# Patient Record
Sex: Female | Born: 1975 | Race: Black or African American | Hispanic: No | Marital: Single | Smoking: Never smoker
Health system: Southern US, Community
[De-identification: ages and names within clinical notes are randomized; demographics above are authoritative.]

## PROBLEM LIST (undated history)

## (undated) HISTORY — PX: ECTOPIC PREGNANCY SURGERY: SHX613

---

## 2008-04-16 ENCOUNTER — Emergency Department (HOSPITAL_COMMUNITY): Admission: EM | Admit: 2008-04-16 | Discharge: 2008-04-16 | Payer: Self-pay | Admitting: Emergency Medicine

## 2009-07-04 ENCOUNTER — Emergency Department (HOSPITAL_BASED_OUTPATIENT_CLINIC_OR_DEPARTMENT_OTHER): Admission: EM | Admit: 2009-07-04 | Discharge: 2009-07-04 | Payer: Self-pay | Admitting: Emergency Medicine

## 2009-07-04 ENCOUNTER — Ambulatory Visit: Payer: Self-pay | Admitting: Diagnostic Radiology

## 2010-07-09 LAB — URINE MICROSCOPIC-ADD ON

## 2010-07-09 LAB — URINALYSIS, ROUTINE W REFLEX MICROSCOPIC
Bilirubin Urine: NEGATIVE
Glucose, UA: NEGATIVE mg/dL
Hgb urine dipstick: NEGATIVE

## 2010-07-09 LAB — PREGNANCY, URINE: Preg Test, Ur: NEGATIVE

## 2010-07-30 LAB — WET PREP, GENITAL: WBC, Wet Prep HPF POC: NONE SEEN

## 2010-07-30 LAB — URINALYSIS, ROUTINE W REFLEX MICROSCOPIC
Glucose, UA: NEGATIVE mg/dL
Ketones, ur: NEGATIVE mg/dL
Nitrite: NEGATIVE
Protein, ur: NEGATIVE mg/dL
Specific Gravity, Urine: 1.01 (ref 1.005–1.030)
Urobilinogen, UA: 0.2 mg/dL (ref 0.0–1.0)
pH: 6 (ref 5.0–8.0)

## 2018-03-11 ENCOUNTER — Emergency Department (HOSPITAL_COMMUNITY): Payer: BLUE CROSS/BLUE SHIELD

## 2018-03-11 ENCOUNTER — Encounter (HOSPITAL_COMMUNITY): Payer: Self-pay

## 2018-03-11 ENCOUNTER — Other Ambulatory Visit: Payer: Self-pay

## 2018-03-11 ENCOUNTER — Emergency Department (HOSPITAL_COMMUNITY)
Admission: EM | Admit: 2018-03-11 | Discharge: 2018-03-11 | Disposition: A | Discharge status: Home / Self Care | Payer: BLUE CROSS/BLUE SHIELD | Attending: Emergency Medicine | Admitting: Emergency Medicine

## 2018-03-11 DIAGNOSIS — R102 Pelvic and perineal pain: Secondary | ICD-10-CM | POA: Diagnosis not present

## 2018-03-11 DIAGNOSIS — R1032 Left lower quadrant pain: Secondary | ICD-10-CM | POA: Diagnosis present

## 2018-03-11 LAB — COMPREHENSIVE METABOLIC PANEL
ALBUMIN: 4 g/dL (ref 3.5–5.0)
ALT: 15 U/L (ref 0–44)
AST: 14 U/L — AB (ref 15–41)
Alkaline Phosphatase: 43 U/L (ref 38–126)
Anion gap: 6 (ref 5–15)
BUN: 10 mg/dL (ref 6–20)
CHLORIDE: 108 mmol/L (ref 98–111)
CO2: 25 mmol/L (ref 22–32)
CREATININE: 0.8 mg/dL (ref 0.44–1.00)
Calcium: 8.8 mg/dL — ABNORMAL LOW (ref 8.9–10.3)
GFR calc Af Amer: >60 mL/min (ref >60)
GFR calc non Af Amer: >60 mL/min (ref >60)
GLUCOSE: 83 mg/dL (ref 70–99)
POTASSIUM: 3.6 mmol/L (ref 3.5–5.1)
SODIUM: 139 mmol/L (ref 135–145)
Total Bilirubin: 0.7 mg/dL (ref 0.3–1.2)
Total Protein: 6.9 g/dL (ref 6.5–8.1)

## 2018-03-11 LAB — URINALYSIS, ROUTINE W REFLEX MICROSCOPIC
Bilirubin Urine: NEGATIVE
Glucose, UA: NEGATIVE mg/dL
Ketones, ur: NEGATIVE mg/dL
Leukocytes, UA: NEGATIVE
NITRITE: NEGATIVE
PROTEIN: NEGATIVE mg/dL
Specific Gravity, Urine: 1.023 (ref 1.005–1.030)
pH: 5 (ref 5.0–8.0)

## 2018-03-11 LAB — WET PREP, GENITAL
Clue Cells Wet Prep HPF POC: NONE SEEN
Sperm: NONE SEEN
TRICH WET PREP: NONE SEEN
Yeast Wet Prep HPF POC: NONE SEEN

## 2018-03-11 LAB — CBC
HEMATOCRIT: 38.4 % (ref 36.0–46.0)
HEMOGLOBIN: 12.4 g/dL (ref 12.0–15.0)
MCH: 30.5 pg (ref 26.0–34.0)
MCHC: 32.3 g/dL (ref 30.0–36.0)
MCV: 94.6 fL (ref 80.0–100.0)
Platelets: 343 10*3/uL (ref 150–400)
RBC: 4.06 MIL/uL (ref 3.87–5.11)
RDW: 12.4 % (ref 11.5–15.5)
WBC: 8.1 10*3/uL (ref 4.0–10.5)
nRBC: 0 % (ref 0.0–0.2)

## 2018-03-11 LAB — LIPASE, BLOOD: LIPASE: 30 U/L (ref 11–51)

## 2018-03-11 LAB — I-STAT BETA HCG BLOOD, ED (MC, WL, AP ONLY)

## 2018-03-11 MED ORDER — AZITHROMYCIN 250 MG PO TABS
1000.0000 mg | ORAL_TABLET | Freq: Once | ORAL | Status: AC
  Administered 2018-03-11: 1000 mg via ORAL
  Filled 2018-03-11: qty 4

## 2018-03-11 MED ORDER — LIDOCAINE HCL (PF) 1 % IJ SOLN
INTRAMUSCULAR | Status: AC
  Administered 2018-03-11: 0.9 mL
  Filled 2018-03-11: qty 2

## 2018-03-11 MED ORDER — IBUPROFEN 800 MG PO TABS: 800.0000 mg | ORAL_TABLET | Freq: Three times a day (TID) | ORAL | 0 refills | Status: AC

## 2018-03-11 MED ORDER — CEFTRIAXONE SODIUM 250 MG IJ SOLR
250.0000 mg | Freq: Once | INTRAMUSCULAR | Status: AC
  Administered 2018-03-11: 250 mg via INTRAMUSCULAR
  Filled 2018-03-11: qty 250

## 2018-03-11 MED ORDER — MORPHINE SULFATE (PF) 4 MG/ML IV SOLN
4.0000 mg | Freq: Once | INTRAVENOUS | Status: AC
  Administered 2018-03-11: 4 mg via INTRAVENOUS
  Filled 2018-03-11: qty 1

## 2018-03-11 MED ORDER — ONDANSETRON HCL 4 MG/2ML IJ SOLN
4.0000 mg | Freq: Once | INTRAMUSCULAR | Status: DC
  Filled 2018-03-11: qty 2

## 2018-03-11 MED ORDER — SODIUM CHLORIDE 0.9 % IV BOLUS
1000.0000 mL | Freq: Once | INTRAVENOUS | Status: AC
  Administered 2018-03-11: 1000 mL via INTRAVENOUS

## 2018-03-11 MED ORDER — MORPHINE SULFATE (PF) 4 MG/ML IV SOLN
4.0000 mg | Freq: Once | INTRAVENOUS | Status: DC
  Filled 2018-03-11: qty 1

## 2018-03-11 MED ORDER — IOPAMIDOL (ISOVUE-300) INJECTION 61%
100.0000 mL | Freq: Once | INTRAVENOUS | Status: AC | PRN
  Administered 2018-03-11: 100 mL via INTRAVENOUS

## 2018-03-11 MED ORDER — ONDANSETRON HCL 4 MG/2ML IJ SOLN
4.0000 mg | Freq: Once | INTRAMUSCULAR | Status: AC
  Administered 2018-03-11: 4 mg via INTRAVENOUS
  Filled 2018-03-11: qty 2

## 2018-03-11 NOTE — ED Notes (Signed)
Patient transported to CT 

## 2018-03-11 NOTE — Discharge Instructions (Addendum)
You will be notified if any of your remaining cultures are positive.  Follow-up with your gynecologist for recheck if your symptoms are not improving.  Return to the ER for any worsening symptoms such as increasing abdominal pain or pelvic pain, fever, or vomiting.

## 2018-03-11 NOTE — ED Notes (Signed)
Went in to give patient morphine and zofran as ordered by PA. Patient states she does not have a driver and does not think she will be able to get a driver. She declined morphine at this time. Also states she is not nauseated and has no vomiting, so she declined zofran also. Medication returned to pyxis. Notified Tammy Triplett PA.

## 2018-03-11 NOTE — ED Triage Notes (Signed)
Pt reports lower abdominal pain for one month which has worsened since yesterday. Pt went to urgent care yesterday. Pt reports new onset of diarrhea

## 2018-03-11 NOTE — ED Provider Notes (Signed)
[facility] EMERGENCY DEPARTMENT Provider Note   CSN: [id number] Arrival date & time: 03/11/18  5366     History   Chief Complaint Chief Complaint  Patient presents with  . Abdominal Pain    HPI Savannah Walker is a 42 y.o. female.  HPI   Savannah Walker is a 42 y.o. female who presents to the Emergency Department complaining of worsening of mid lower and left abdominal pain.  She endorses pain for 1 month, but states that she has recently noticed the pain has been increasing and now associated with watery stools.  She describes the pain as constant with intermittent sharp pains.  Pain is not associated with food intake or defecation.  States her menses has been regular with last menstrual period on 02/27/2018.  She endorses some abdominal cramping with her last menstrual cycle which is unusual for her.  Some nausea but no vomiting.  She denies fever, chills, dysuria, abnormal vaginal bleeding and history of previous abdominal surgeries.  She was seen at urgent care yesterday without clear diagnosis.  No recent antibiotics or sick contacts.    History reviewed. No pertinent past medical history.  There are no active problems to display for this patient.   Past Surgical History:  Procedure Laterality Date  . ECTOPIC PREGNANCY SURGERY       OB History   None      Home Medications    Prior to Admission medications   Not on File    Family History No family history on file.  Social History Social History   Tobacco Use  . Smoking status: Never Smoker  Substance Use Topics  . Alcohol use: Yes    Comment: OCC  . Drug use: Never     Allergies   Compazine [prochlorperazine edisylate]   Review of Systems Review of Systems  Constitutional: Negative for appetite change, chills and fever.  Respiratory: Negative for shortness of breath.   Cardiovascular: Negative for chest pain.  Gastrointestinal: Positive for abdominal pain and diarrhea. Negative for blood in stool,  nausea and vomiting.  Genitourinary: Negative for decreased urine volume, difficulty urinating, dysuria and flank pain.  Musculoskeletal: Negative for back pain.  Skin: Negative for color change and rash.  Neurological: Negative for dizziness, weakness and numbness.  Hematological: Negative for adenopathy.     Physical Exam Updated Vital Signs BP 127/67 (BP Location: Left Arm)   Pulse 82   Temp 98.1 F (36.7 C) (Oral)   Resp 16   Ht 5\' 5"  (1.651 m)   Wt 80.3 kg   LMP 02/27/2018   SpO2 100%   BMI 29.45 kg/m   Physical Exam  Constitutional: She does not appear ill. No distress.  HENT:  Head: Normocephalic.  Mouth/Throat: Oropharynx is clear and moist.  Neck: Normal range of motion. No JVD present.  Cardiovascular: Normal rate, regular rhythm and intact distal pulses.  Pulmonary/Chest: Effort normal and breath sounds normal. No respiratory distress.  Abdominal: Soft. Normal appearance and bowel sounds are normal. There is tenderness in the left lower quadrant. There is no rigidity, no rebound, no guarding, no CVA tenderness and no tenderness at McBurney's point.    ttp of the mid and left lower abdomen.  No guarding or rebound tenderness.    Genitourinary: There is no rash or tenderness on the right labia. There is no rash or tenderness on the left labia. Uterus is not tender. Cervix exhibits no motion tenderness. Right adnexum displays no mass and no tenderness. Left  adnexum displays no mass and no tenderness. No bleeding in the vagina. Vaginal discharge found.  Genitourinary Comments: Pelvic assisted by nursing.  Mild CMT, Ek vaginal d/c.  No adnexal masses or tenderness.    Musculoskeletal: Normal range of motion.  Neurological: She is alert. No sensory deficit.  Skin: Skin is warm. Capillary refill takes less than 2 seconds. No rash noted.  Psychiatric: She has a normal mood and affect.  Nursing note and vitals reviewed.    ED Treatments / Results  Labs (all labs  ordered are listed, but only abnormal results are displayed) Labs Reviewed  WET PREP, GENITAL - Abnormal; Notable for the following components:      Result Value   WBC, Wet Prep HPF POC MANY (*)    All other components within normal limits  COMPREHENSIVE METABOLIC PANEL - Abnormal; Notable for the following components:   Calcium 8.8 (*)    AST 14 (*)    All other components within normal limits  URINALYSIS, ROUTINE W REFLEX MICROSCOPIC - Abnormal; Notable for the following components:   APPearance HAZY (*)    Hgb urine dipstick SMALL (*)    Bacteria, UA RARE (*)    All other components within normal limits  LIPASE, BLOOD  CBC  I-STAT BETA HCG BLOOD, ED ([facility], [facility], [facility] ONLY)  GC/CHLAMYDIA PROBE AMP ([place]) NOT AT [facility]    EKG None  Radiology Ct Abdomen Pelvis W Contrast  Result Date: 03/11/2018 CLINICAL DATA:  Acute lower abdominal pain. EXAM: CT ABDOMEN AND PELVIS WITH CONTRAST TECHNIQUE: Multidetector CT imaging of the abdomen and pelvis was performed using the standard protocol following bolus administration of intravenous contrast. CONTRAST:  100mL ISOVUE-300 IOPAMIDOL (ISOVUE-300) INJECTION 61% COMPARISON:  None. FINDINGS: Lower chest: No acute abnormality. Hepatobiliary: No focal liver abnormality is seen. No gallstones, gallbladder wall thickening, or biliary dilatation. Pancreas: Unremarkable. No pancreatic ductal dilatation or surrounding inflammatory changes. Spleen: Normal in size without focal abnormality. Adrenals/Urinary Tract: Adrenal glands are unremarkable. Kidneys are normal, without renal calculi, focal lesion, or hydronephrosis. Bladder is unremarkable. Stomach/Bowel: Stomach is within normal limits. Appendix appears normal. No evidence of bowel wall thickening, distention, or inflammatory changes. Vascular/Lymphatic: No significant vascular findings are present. No enlarged abdominal or pelvic lymph nodes. Reproductive: Uterus and bilateral adnexa are unremarkable.  Other: Small fat containing periumbilical hernia is noted. No ascites is noted. Musculoskeletal: No acute or significant osseous findings. IMPRESSION: Small fat containing periumbilical hernia. No other abnormality seen in the abdomen or pelvis. Electronically Signed   By: Lupita RaiderJames  Green Jr, M.D.   On: 03/11/2018 11:56    Procedures Procedures (including critical care time)  Medications Ordered in ED Medications  sodium chloride 0.9 % bolus 1,000 mL (0 mLs Intravenous Stopped 03/11/18 1207)  morphine 4 MG/ML injection 4 mg (4 mg Intravenous Given 03/11/18 1159)  ondansetron (ZOFRAN) injection 4 mg (4 mg Intravenous Given 03/11/18 1159)  iopamidol (ISOVUE-300) 61 % injection 100 mL (100 mLs Intravenous Contrast Given 03/11/18 1131)  azithromycin (ZITHROMAX) tablet 1,000 mg (1,000 mg Oral Given 03/11/18 1311)  cefTRIAXone (ROCEPHIN) injection 250 mg (250 mg Intramuscular Given 03/11/18 1311)  lidocaine (PF) (XYLOCAINE) 1 % injection (0.9 mLs  Given 03/11/18 1312)     Initial Impression / Assessment and Plan / ED Course  I have reviewed the triage vital signs and the nursing notes.  Pertinent labs & imaging results that were available during my care of the patient were reviewed by me and considered in my medical  decision making (see chart for details).     1033 patient offered morphine and Zofran for pain, states she does not have a driver medication was declined.  1200  Pt now requesting pain medication  CT A/P and lab work reassuring.  Mild CMT and vaginal tenderness on exam.  Given IM rocephin and zithromax here, cultures results pending.  Pt feeling better and appears appropriate for d/c home.  Pt agrees to out pt f/u and strict return precautions discussed.      Final Clinical Impressions(s) / ED Diagnoses   Final diagnoses:  Pelvic pain in female    ED Discharge Orders    None       Rosey Bath 03/12/18 2024    Donnetta Hutching, MD 03/13/18 [phone]

## 2018-03-13 LAB — GC/CHLAMYDIA PROBE AMP (~~LOC~~) NOT AT ARMC
CHLAMYDIA, DNA PROBE: NEGATIVE
Neisseria Gonorrhea: NEGATIVE

## 2019-04-24 IMAGING — CT CT ABD-PELV W/ CM
2 of 5 series · 17 of 46 positions shown, 19 images · IV contrast (Isovue)
Comparison: None.

CLINICAL DATA: Acute lower abdominal pain.

EXAM:
CT ABDOMEN AND PELVIS WITH CONTRAST
TECHNIQUE: Multidetector CT imaging of the abdomen and pelvis was performed
using the standard protocol following bolus administration of
intravenous contrast.
CONTRAST:  100mL WGVYFX-OMM IOPAMIDOL (WGVYFX-OMM) INJECTION 61%

[Series 2: axial st · axial · 0.67mm/px · z∈[+830,+1245]mm · 14 of 93 slices shown, 16 images]
[im 5/93  soft-tissue]
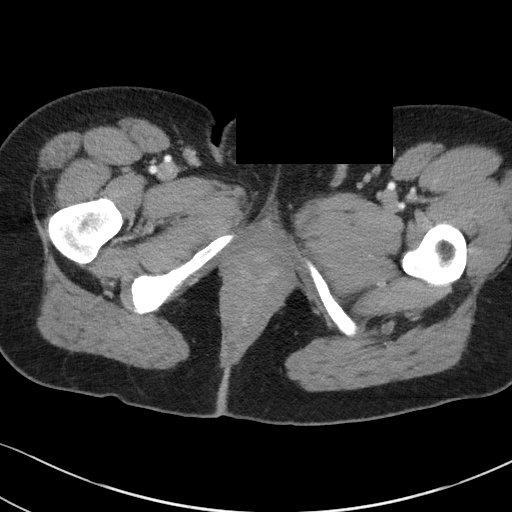
[im 5/93  bone]
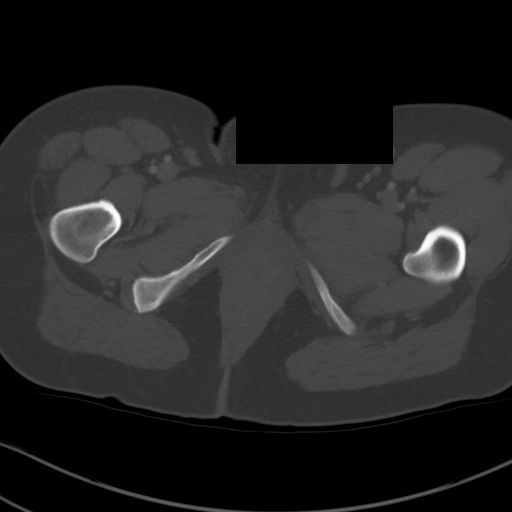
[im 10/93  soft-tissue]
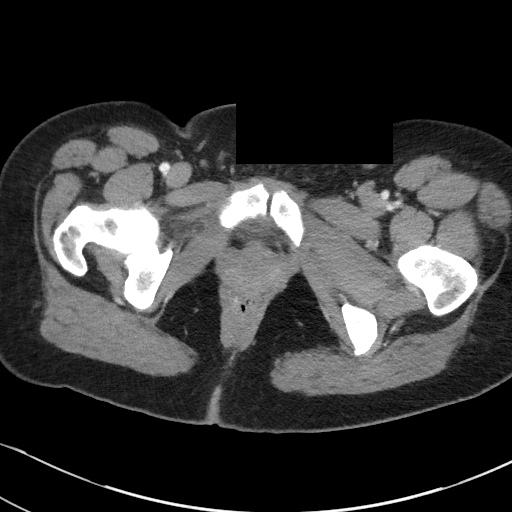
[im 20/93  soft-tissue]
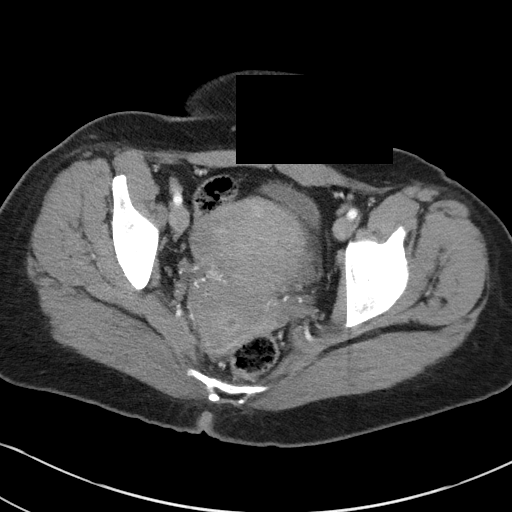
[im 25/93  soft-tissue]
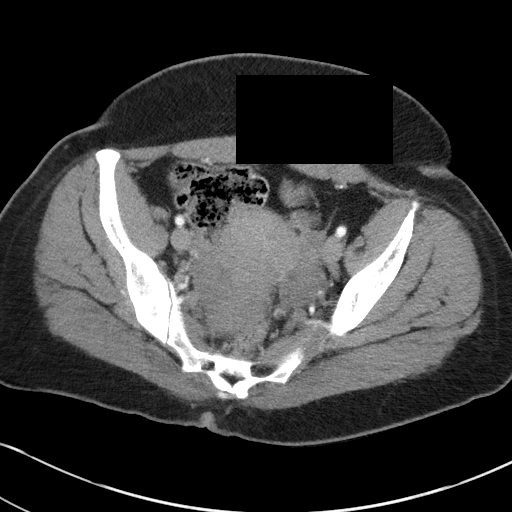
[im 30/93  soft-tissue]
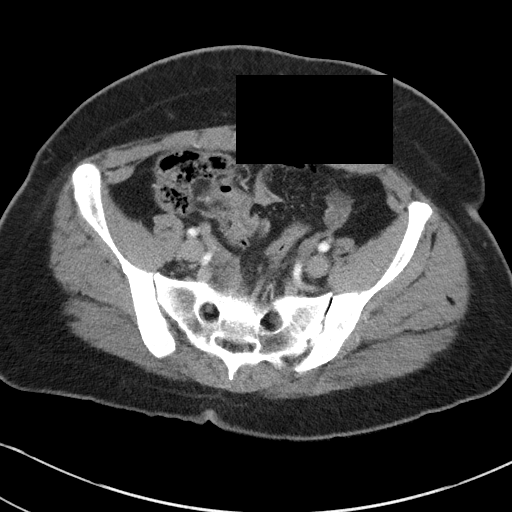
[im 39/93  soft-tissue]
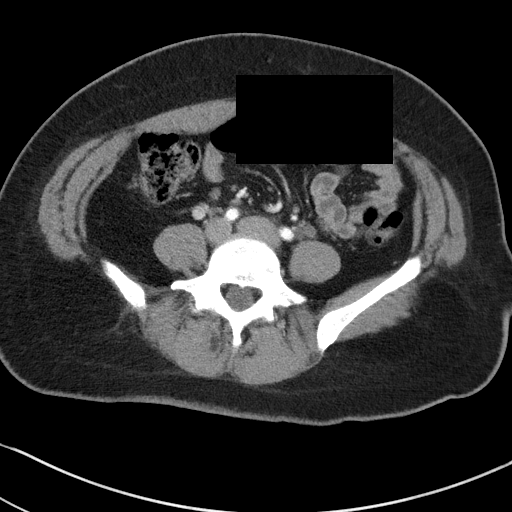
[im 44/93  soft-tissue]
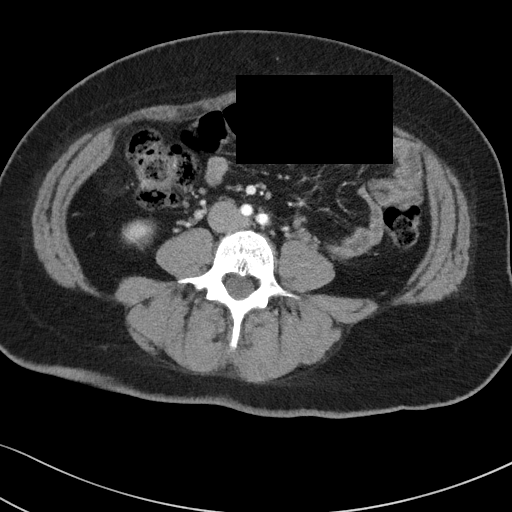
[im 49/93  soft-tissue]
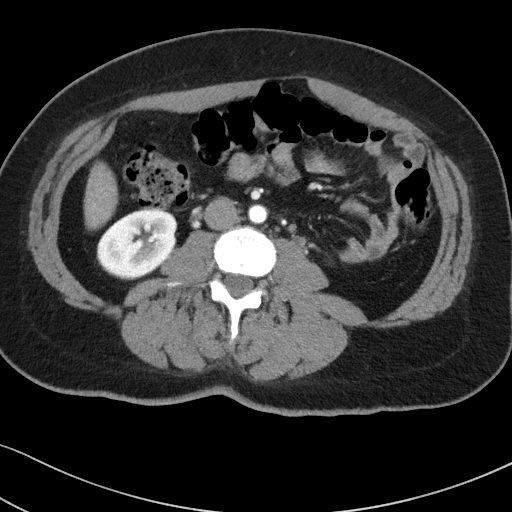
[im 54/93  soft-tissue]
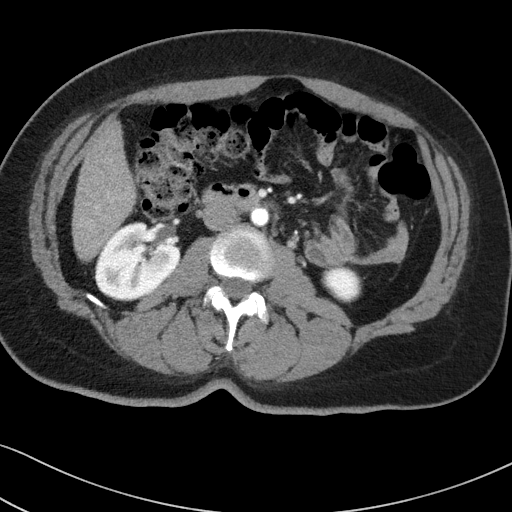
[im 54/93  bone]
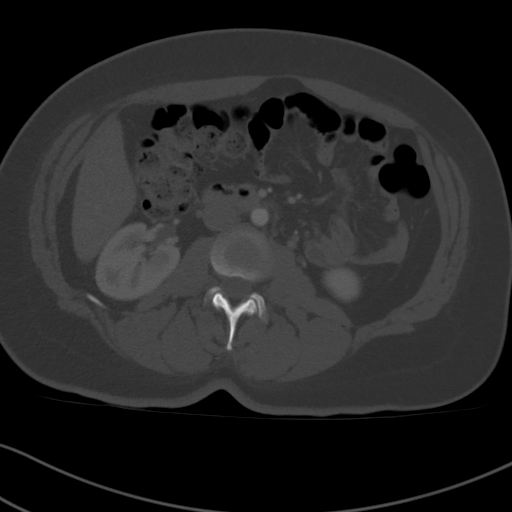
[im 63/93  soft-tissue]
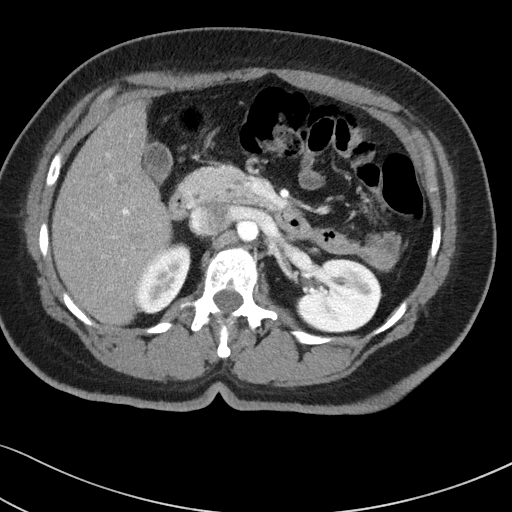
[im 68/93  soft-tissue]
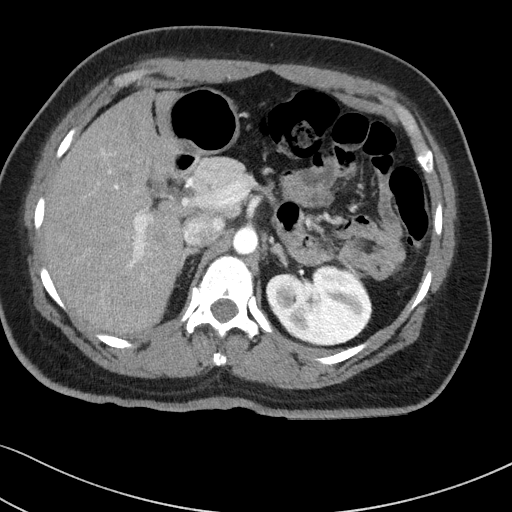
[im 73/93  soft-tissue]
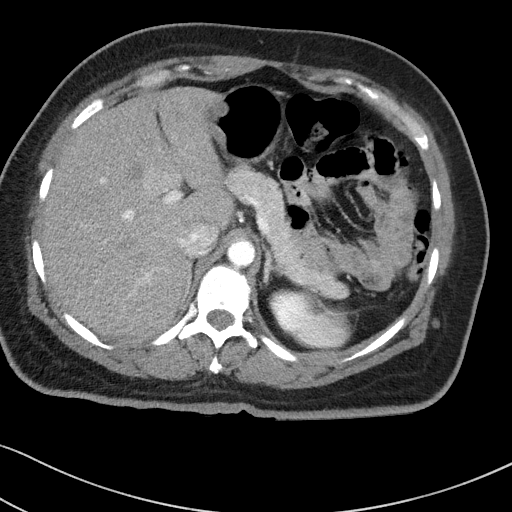
[im 83/93  soft-tissue]
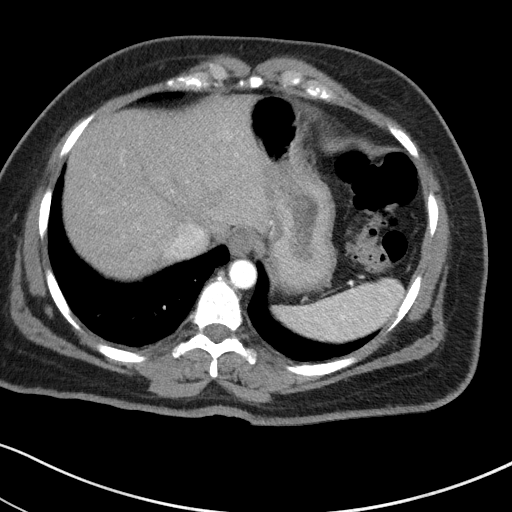
[im 88/93  soft-tissue]
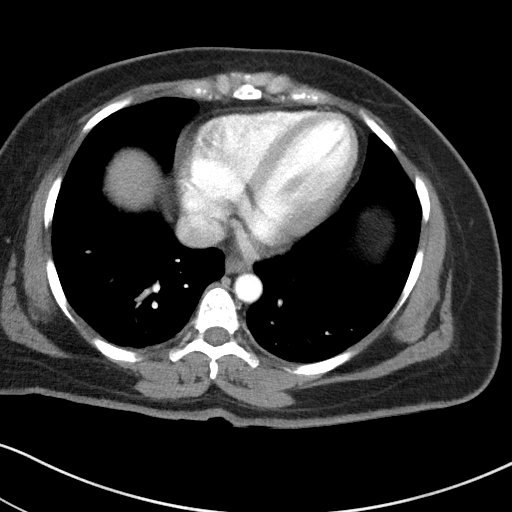

[Series 6: coronal st · coronal · 0.80mm/px · 3 of 85 slices shown]
[im 29/85  soft-tissue]
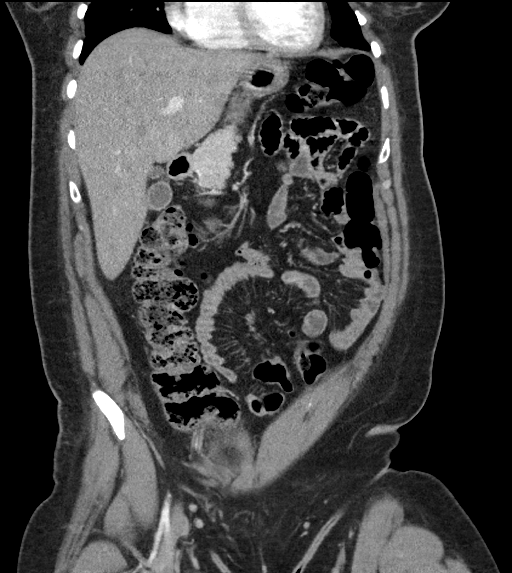
[im 38/85  soft-tissue]
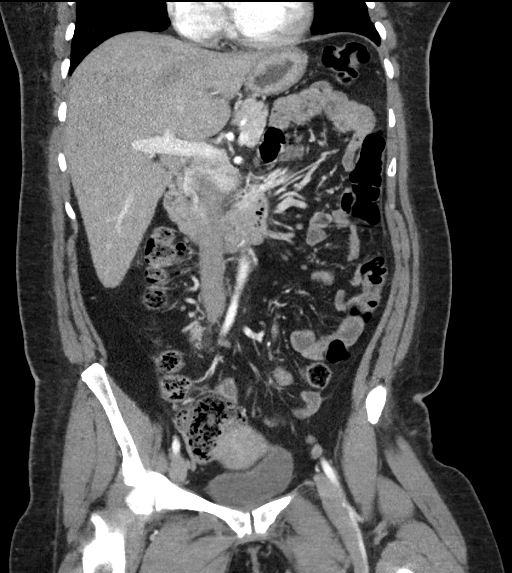
[im 47/85  soft-tissue]
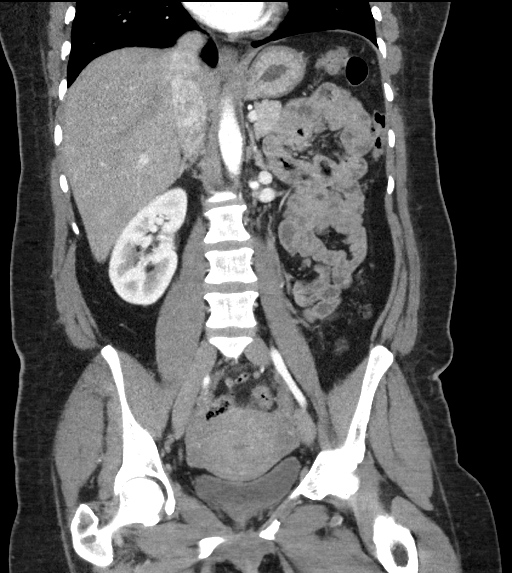

[17 of 46 positions shown; findings below may reference images not displayed]

FINDINGS: Lower chest: No acute abnormality.

Hepatobiliary: No focal liver abnormality is seen. No gallstones,
gallbladder wall thickening, or biliary dilatation.

Pancreas: Unremarkable. No pancreatic ductal dilatation or
surrounding inflammatory changes.

Spleen: Normal in size without focal abnormality.

Adrenals/Urinary Tract: Adrenal glands are unremarkable. Kidneys are
normal, without renal calculi, focal lesion, or hydronephrosis.
Bladder is unremarkable.

Stomach/Bowel: Stomach is within normal limits. Appendix appears
normal. No evidence of bowel wall thickening, distention, or
inflammatory changes.

Vascular/Lymphatic: No significant vascular findings are present. No
enlarged abdominal or pelvic lymph nodes.

Reproductive: Uterus and bilateral adnexa are unremarkable.

Other: Small fat containing periumbilical hernia is noted. No
ascites is noted.

Musculoskeletal: No acute or significant osseous findings.
IMPRESSION: Small fat containing periumbilical hernia. No other abnormality seen
in the abdomen or pelvis.

## 2020-04-13 ENCOUNTER — Encounter (HOSPITAL_COMMUNITY): Payer: Self-pay | Admitting: Emergency Medicine

## 2020-04-13 ENCOUNTER — Emergency Department (HOSPITAL_COMMUNITY): Payer: Medicaid Other

## 2020-04-13 ENCOUNTER — Other Ambulatory Visit: Payer: Self-pay

## 2020-04-13 ENCOUNTER — Emergency Department (HOSPITAL_COMMUNITY)
Admission: EM | Admit: 2020-04-13 | Discharge: 2020-04-13 | Disposition: A | Discharge status: Home / Self Care | Payer: Medicaid Other | Attending: Emergency Medicine | Admitting: Emergency Medicine

## 2020-04-13 DIAGNOSIS — N342 Other urethritis: Secondary | ICD-10-CM | POA: Insufficient documentation

## 2020-04-13 DIAGNOSIS — N369 Urethral disorder, unspecified: Secondary | ICD-10-CM | POA: Diagnosis present

## 2020-04-13 LAB — WET PREP, GENITAL
Sperm: NONE SEEN
Trich, Wet Prep: NONE SEEN
Yeast Wet Prep HPF POC: NONE SEEN

## 2020-04-13 LAB — CBC
HCT: 38.8 % (ref 36.0–46.0)
Hemoglobin: 12.6 g/dL (ref 12.0–15.0)
MCH: 30.8 pg (ref 26.0–34.0)
MCHC: 32.5 g/dL (ref 30.0–36.0)
MCV: 94.9 fL (ref 80.0–100.0)
Platelets: 356 10*3/uL (ref 150–400)
RBC: 4.09 MIL/uL (ref 3.87–5.11)
RDW: 12.6 % (ref 11.5–15.5)
WBC: 8.6 10*3/uL (ref 4.0–10.5)
nRBC: 0 % (ref 0.0–0.2)

## 2020-04-13 LAB — URINALYSIS, ROUTINE W REFLEX MICROSCOPIC
Bacteria, UA: NONE SEEN
Bilirubin Urine: NEGATIVE
Glucose, UA: NEGATIVE mg/dL
Ketones, ur: NEGATIVE mg/dL
Leukocytes,Ua: NEGATIVE
Nitrite: NEGATIVE
Protein, ur: NEGATIVE mg/dL
Specific Gravity, Urine: 1.025 (ref 1.005–1.030)
pH: 5 (ref 5.0–8.0)

## 2020-04-13 LAB — COMPREHENSIVE METABOLIC PANEL
ALT: 21 U/L (ref 0–44)
AST: 19 U/L (ref 15–41)
Albumin: 3.9 g/dL (ref 3.5–5.0)
Alkaline Phosphatase: 43 U/L (ref 38–126)
Anion gap: 8 (ref 5–15)
BUN: 10 mg/dL (ref 6–20)
CO2: 23 mmol/L (ref 22–32)
Calcium: 8.6 mg/dL — ABNORMAL LOW (ref 8.9–10.3)
Chloride: 106 mmol/L (ref 98–111)
Creatinine, Ser: 0.69 mg/dL (ref 0.44–1.00)
GFR, Estimated: >60 mL/min (ref >60)
Glucose, Bld: 105 mg/dL — ABNORMAL HIGH (ref 70–99)
Potassium: 3.6 mmol/L (ref 3.5–5.1)
Sodium: 137 mmol/L (ref 135–145)
Total Bilirubin: 0.4 mg/dL (ref 0.3–1.2)
Total Protein: 7 g/dL (ref 6.5–8.1)

## 2020-04-13 LAB — PREGNANCY, URINE: Preg Test, Ur: NEGATIVE

## 2020-04-13 MED ORDER — KETOROLAC TROMETHAMINE 60 MG/2ML IM SOLN
60.0000 mg | Freq: Once | INTRAMUSCULAR | Status: AC
  Administered 2020-04-13: 60 mg via INTRAMUSCULAR
  Filled 2020-04-13: qty 2

## 2020-04-13 MED ORDER — ONDANSETRON 4 MG PO TBDP: 4.0000 mg | ORAL_TABLET | Freq: Three times a day (TID) | ORAL | 0 refills | Status: DC | PRN

## 2020-04-13 MED ORDER — DOXYCYCLINE HYCLATE 100 MG PO TABS
100.0000 mg | ORAL_TABLET | Freq: Once | ORAL | Status: AC
  Administered 2020-04-13: 100 mg via ORAL
  Filled 2020-04-13: qty 1

## 2020-04-13 MED ORDER — DOXYCYCLINE HYCLATE 100 MG PO CAPS: 100.0000 mg | ORAL_CAPSULE | Freq: Two times a day (BID) | ORAL | 0 refills | Status: DC

## 2020-04-13 MED ORDER — METRONIDAZOLE 500 MG PO TABS: 500.0000 mg | ORAL_TABLET | Freq: Two times a day (BID) | ORAL | 0 refills | Status: DC

## 2020-04-13 MED ORDER — METRONIDAZOLE 500 MG PO TABS
500.0000 mg | ORAL_TABLET | Freq: Once | ORAL | Status: AC
  Administered 2020-04-13: 500 mg via ORAL
  Filled 2020-04-13: qty 1

## 2020-04-13 MED ORDER — CEFTRIAXONE SODIUM 500 MG IJ SOLR
500.0000 mg | Freq: Once | INTRAMUSCULAR | Status: AC
  Administered 2020-04-13: 500 mg via INTRAMUSCULAR
  Filled 2020-04-13: qty 500

## 2020-04-13 NOTE — ED Provider Notes (Signed)
[facility] EMERGENCY DEPARTMENT Provider Note   CSN: [id number] Arrival date & time: 04/13/20  1103     History Chief Complaint  Patient presents with  . Recurrent UTI    Savannah Walker is a 44 y.o. female.  HPI   44 year old female with a history of ectopic pregnancy presents to the ER with complaints of urethral burning x2 weeks.  Patient states that she "has a UTI", has burning around her urethral area at all times even when she does not urinate.  She was seen at urgent care 2 weeks ago, per chart review UA on 12/14 with positive nitrites and leukocyte esterase.  Urine culture was sent, however it only grew mixed urogenital flora less than 10,000 colonies.  She states that she was given Bactrim and has been compliant with the medication.  She finished it, but continues to have symptoms, and in fact they seem to be getting worse.  She now has some generalized low back aching.  No fevers or chills.  Has some mild abdominal discomfort.  Has not noticed any vaginal bleeding or discharge.  She is sexually with 1 partner and they do not use a barrier method.  She has not taken anything for her symptoms.  She states that she just started her period and has noticed blood in her urine, which she attributes to her menstrual cycle.  No cough, nasal congestion, chest pain or shortness of breath.  No past medical history on file.  There are no problems to display for this patient.   Past Surgical History:  Procedure Laterality Date  . ECTOPIC PREGNANCY SURGERY       OB History   No obstetric history on file.     No family history on file.  Social History   Tobacco Use  . Smoking status: Never Smoker  . Smokeless tobacco: Never Used  Vaping Use  . Vaping Use: Never used  Substance Use Topics  . Alcohol use: Yes    Comment: OCC  . Drug use: Never    Home Medications Prior to Admission medications   Medication Sig Start Date End Date Taking? Authorizing Provider  doxycycline  (VIBRAMYCIN) 100 MG capsule Take 1 capsule (100 mg total) by mouth 2 (two) times daily. 04/13/20  Yes Mare Ferrari, PA-C  metroNIDAZOLE (FLAGYL) 500 MG tablet Take 1 tablet (500 mg total) by mouth 2 (two) times daily. 04/13/20  Yes Trudee Grip A, PA-C  ondansetron (ZOFRAN ODT) 4 MG disintegrating tablet Take 1 tablet (4 mg total) by mouth every 8 (eight) hours as needed for nausea or vomiting. 04/13/20  Yes Mare Ferrari, PA-C  cetirizine (ZYRTEC) 10 MG tablet Take by mouth. 07/25/16   [provider]  fluticasone (FLONASE) 50 MCG/ACT nasal spray Place into the nose. 07/25/16   [provider]  ibuprofen (ADVIL,MOTRIN) 800 MG tablet Take 1 tablet (800 mg total) by mouth 3 (three) times daily. 03/11/18   Triplett, Tammy, PA-C  norethindrone (MICRONOR,CAMILA,ERRIN) 0.35 MG tablet Take by mouth. 01/10/17   [provider]    Allergies    Compazine [prochlorperazine edisylate]  Review of Systems   Review of Systems  Constitutional: Negative for chills and fever.  HENT: Negative for ear pain and sore throat.   Eyes: Negative for pain and visual disturbance.  Respiratory: Negative for cough and shortness of breath.   Cardiovascular: Negative for chest pain and palpitations.  Gastrointestinal: Negative for abdominal pain and vomiting.  Genitourinary: Positive for dyspareunia, dysuria, flank  pain and vaginal bleeding. Negative for decreased urine volume, hematuria and vaginal pain.  Musculoskeletal: Positive for back pain. Negative for arthralgias.  Skin: Negative for color change and rash.  Neurological: Negative for seizures and syncope.  All other systems reviewed and are negative.   Physical Exam Updated Vital Signs BP 129/69   Pulse 78   Temp 98.8 F (37.1 C) (Oral)   Resp 16   Ht 5\' 5"  (1.651 m)   Wt 77.6 kg   LMP 04/06/2020 (Exact Date)   SpO2 99%   BMI 28.46 kg/m   Physical Exam Vitals and nursing note reviewed. Exam conducted with a chaperone  present.  Constitutional:      General: She is not in acute distress.    Appearance: She is well-developed and well-nourished.  HENT:     Head: Normocephalic and atraumatic.  Eyes:     Conjunctiva/sclera: Conjunctivae normal.  Cardiovascular:     Rate and Rhythm: Normal rate and regular rhythm.     Heart sounds: No murmur heard.   Pulmonary:     Effort: Pulmonary effort is normal. No respiratory distress.     Breath sounds: Normal breath sounds.  Abdominal:     Palpations: Abdomen is soft.     Tenderness: There is no abdominal tenderness. There is no right CVA tenderness or left CVA tenderness.  Genitourinary:    Vagina: Normal.     Cervix: Dilated. Discharge present. No cervical motion tenderness or friability.     Uterus: Normal.      Adnexa: Right adnexa normal and left adnexa normal.       Right: No mass, tenderness or fullness.         Left: No tenderness or fullness.    Musculoskeletal:        General: Tenderness present. No edema.     Cervical back: Neck supple.     Comments: Mild paraspinal muscle tenderness  Skin:    General: Skin is warm and dry.  Neurological:     General: No focal deficit present.     Mental Status: She is alert and oriented to person, place, and time.     Sensory: No sensory deficit.     Motor: No weakness.     Coordination: Coordination normal.  Psychiatric:        Mood and Affect: Mood and affect and mood normal.        Behavior: Behavior normal.     ED Results / Procedures / Treatments   Labs (all labs ordered are listed, but only abnormal results are displayed) Labs Reviewed  WET PREP, GENITAL - Abnormal; Notable for the following components:      Result Value   Clue Cells Wet Prep HPF POC FEW (*)    WBC, Wet Prep HPF POC MODERATE (*)    All other components within normal limits  URINALYSIS, ROUTINE W REFLEX MICROSCOPIC - Abnormal; Notable for the following components:   Hgb urine dipstick SMALL (*)    All other components  within normal limits  COMPREHENSIVE METABOLIC PANEL - Abnormal; Notable for the following components:   Glucose, Bld 105 (*)    Calcium 8.6 (*)    All other components within normal limits  CBC  PREGNANCY, URINE  GC/CHLAMYDIA PROBE AMP ([place]) NOT AT [facility]    EKG None  Radiology CT RENAL STONE STUDY  Result Date: 04/13/2020 CLINICAL DATA:  Centralized abdominal pain for the past 2 weeks. Evaluate for nephrolithiasis. EXAM: CT ABDOMEN AND  PELVIS WITHOUT CONTRAST TECHNIQUE: Multidetector CT imaging of the abdomen and pelvis was performed following the standard protocol without IV contrast. COMPARISON:  03/11/2018 FINDINGS: The lack of intravenous contrast limits the ability to evaluate solid abdominal organs. Lower chest: Limited visualization of the lower thorax is negative for focal airspace opacity or pleural effusion. Normal heart size.  No pericardial effusion. Hepatobiliary: Normal hepatic contour. Normal appearance of the gallbladder scattered normal noncontrast appearance of the gallbladder given degree distention. No radiopaque gallstones. No ascites. Pancreas: Normal noncontrast appearance of the pancreas. Spleen: Normal noncontrast appearance of the spleen. Adrenals/Urinary Tract: Punctate (2 mm) nonobstructing renal stones are seen within the superior pole the left kidney (coronal images 65 and 67, series 5). No definite right-sided renal stones. No renal stones are seen along expected course of either ureter. Several punctate phleboliths are seen with the lower pelvis bilaterally, similar to the 2019 examination. Normal noncontrast appearance of the urinary bladder given degree of distention. No urinary obstruction or perinephric stranding. Normal noncontrast appearance the bilateral adrenal glands. Stomach/Bowel: Large colonic stool burden without evidence of enteric obstruction. Normal noncontrast appearance of the terminal ileum and the retrocecal appendix. No discrete areas of  bowel wall thickening on this noncontrast examination. No significant hiatal hernia. No pneumoperitoneum, pneumatosis or portal venous gas. Vascular/Lymphatic: Normal caliber of the abdominal aorta. Scattered retroperitoneal and mesenteric lymph nodes are numerous though individually not enlarged by size criteria, similar to the 2019 examination presumably reactive in etiology. Index mesenteric root lymph node measures 0.6 cm in greatest short axis diameter (image 45, series 2). No definitive bulky retroperitoneal, mesenteric, pelvic or inguinal lymphadenopathy on this noncontrast examination. Reproductive: Stable noncontrast appearance of the pelvic organs. No free fluid within the pelvic cul-de-sac. Other: Small mesenteric fat containing periumbilical hernia. Musculoskeletal: No acute or aggressive osseous abnormalities. IMPRESSION: 1. No definite explanation for patient's centralized abdominal pain. Specifically, no evidence of enteric or urinary obstruction. Normal noncontrast appearance of the appendix. 2. Punctate (2 mm) nonobstructing left-sided renal stones. No evidence of right-sided nephrolithiasis. Electronically Signed   By: Simonne ComeJohn  Watts M.D.   On: 04/13/2020 15:02    Procedures Procedures (including critical care time)  Medications Ordered in ED Medications  cefTRIAXone (ROCEPHIN) injection 500 mg (has no administration in time range)  doxycycline (VIBRA-TABS) tablet 100 mg (has no administration in time range)  metroNIDAZOLE (FLAGYL) tablet 500 mg (has no administration in time range)  ketorolac (TORADOL) injection 60 mg (60 mg Intramuscular Given 04/13/20 1331)    ED Course  I have reviewed the triage vital signs and the nursing notes.  Pertinent labs & imaging results that were available during my care of the patient were reviewed by me and considered in my medical decision making (see chart for details).    MDM Rules/Calculators/A&P                          CC: Urethral  burning, dysuria, abdominal discomfort and back pain VS: On arrival, overall reassuring, afebrile, not hypotensive, tachypneic or tachycardic ZO:[id number]:History is gathered by discussion with the patient. Previous records obtained and reviewed. DDX:The patient's complaint of dysuria, flank pain involves an extensive number of diagnostic and treatment options, and is a complaint that carries with it a high risk of complications, morbidity, and potential mortality. Given the large differential diagnosis, medical decision making is of high complexity.  This includes pyelonephritis, renal stones, PID, TOA, ovarian torsion Labs: I ordered reviewed and interpreted labs  which include  -CBC without leukocytosis -CMP without any significant electrode abnormalities, normal renal and liver function test -UA with small out of hemoglobin, no evidence of UTI.  No RBCs. -Wet prep with few clue cells, moderate WBCs. Imaging: I ordered and reviewed images which included CT renal study. I independently visualized and interpreted all imaging. Significant findings include punctate 2 mm nonobstructing left-sided renal stone, no evidence of obstruction   MDM: Work-up overall reassuring.  Wet prep suggestive of bacterial vaginosis.  She has no cervical motion tenderness, no adnexal tenderness.  Low suspicion for PID, ovarian torsion, TOA.  I discussed that the patient's STD results will result in approximately 2 days, patient would like to be treated here.  We will treat with Rocephin, doxy, Flagyl for BV.  Patient's pain improved with Toradol.  Overall well-appearing.  Afebrile, low suspicion for sepsis.  Encouraged the patient to follow-up on the results for the STD testing via MyChart, however she will be treated today.Zofran provided. STD education provided.  Encouraged follow-up with the PCP her symptoms persist.  She voiced understanding and is agreeable.  Patient disposition:The patient appears reasonably screened and/or  stabilized for discharge and I doubt any other medical condition or other [facility] requiring further screening, evaluation, or treatment in the ED at this time prior to discharge. I have discussed lab and/or imaging findings with the patient and answered all questions/concerns to the best of my ability.I have discussed return precautions and OP follow up.    Final Clinical Impression(s) / ED Diagnoses Final diagnoses:  Urethritis    Rx / DC Orders ED Discharge Orders         Ordered    doxycycline (VIBRAMYCIN) 100 MG capsule  2 times daily        04/13/20 1522    metroNIDAZOLE (FLAGYL) 500 MG tablet  2 times daily        04/13/20 1522    ondansetron (ZOFRAN ODT) 4 MG disintegrating tablet  Every 8 hours PRN        04/13/20 1522           Leone Brand 04/13/20 1525    Mancel Bale, MD 04/14/20 1645

## 2020-04-13 NOTE — ED Triage Notes (Signed)
Recurrent UTI, treated at Urgent Care 2 weeks ago with Bacitum; not relief.  Reports back pain and vagina discomfort is worse.  LMP 7 days ago.

## 2020-04-13 NOTE — Discharge Instructions (Signed)
Please follow-up on the results of your tests run today via MyChart.  However you were treated for STDs today.  Your wet prep also showed possible bacterial vaginosis, for which we sent in another antibiotic called Flagyl for.  This medication can make you nauseous, and do not drink alcohol while taking this medication.  I did send in some nausea medicine called Zofran in case you need it.  Make sure to take the antibiotics until finished.  Please make sure to have all partners treated, and abstain from sex for 2 weeks if your results are positive.  If they are positive, you will receive a call from the health department as well.  Please follow-up with a primary care doctor if your symptoms continue.  If you do not have a primary care doctor, please call the phone number in your discharge paperwork in order to establish with 1.  Return to the ER for any new or worsening symptoms.

## 2020-04-17 LAB — GC/CHLAMYDIA PROBE AMP (~~LOC~~) NOT AT ARMC
Chlamydia: NEGATIVE
Comment: NEGATIVE
Comment: NORMAL
Neisseria Gonorrhea: NEGATIVE

## 2021-05-27 IMAGING — CT CT RENAL STONE PROTOCOL
2 of 4 series · 15 of 46 positions shown, 17 images · non-contrast
Comparison: 03/11/2018

CLINICAL DATA: Centralized abdominal pain for the past 2 weeks.
Evaluate for nephrolithiasis.

EXAM:
CT ABDOMEN AND PELVIS WITHOUT CONTRAST
TECHNIQUE: Multidetector CT imaging of the abdomen and pelvis was performed
following the standard protocol without IV contrast.

[Series 2: axial st · axial · 0.81mm/px · z∈[-649,-234]mm · 12 of 93 slices shown, 14 images]
[im 5/93  soft-tissue]
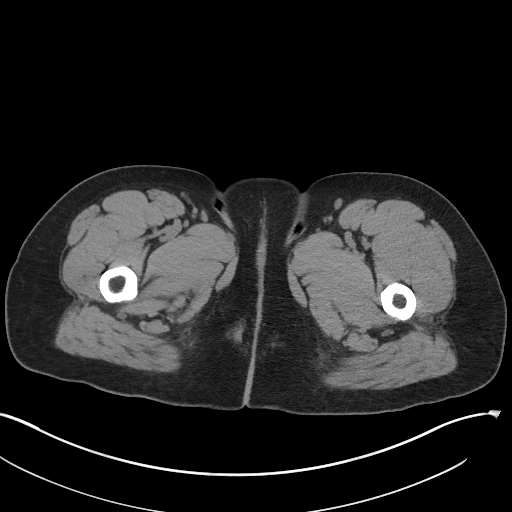
[im 5/93  bone]
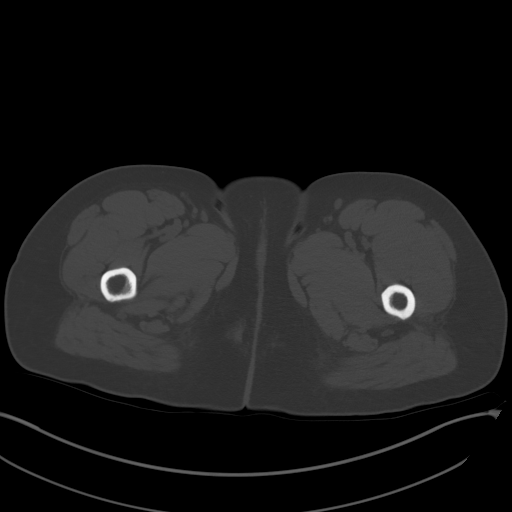
[im 14/93  soft-tissue]
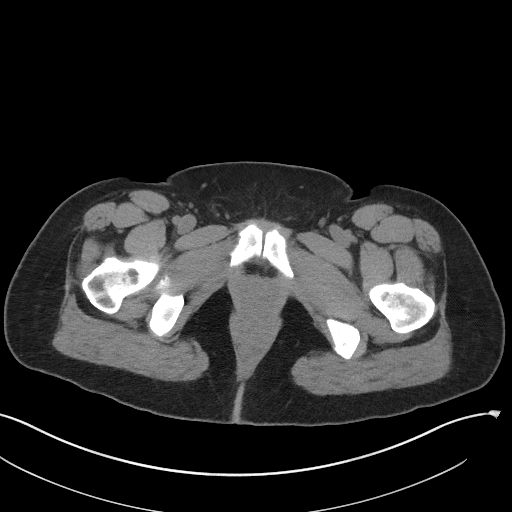
[im 19/93  soft-tissue]
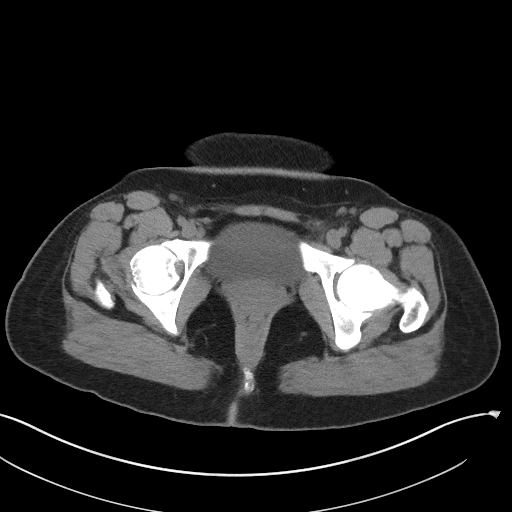
[im 28/93  soft-tissue]
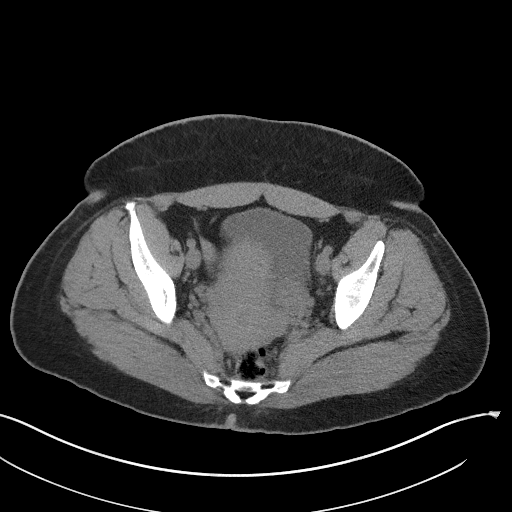
[im 37/93  soft-tissue]
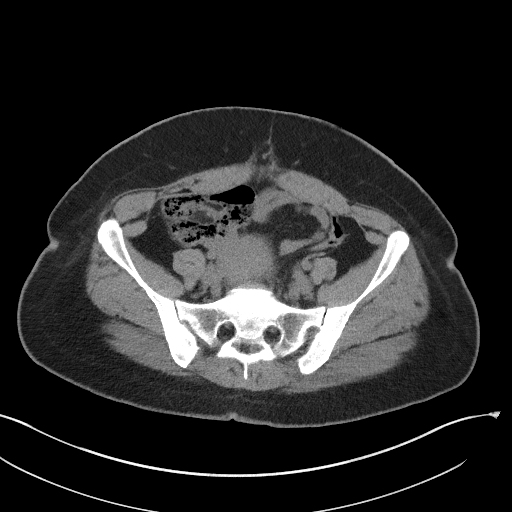
[im 42/93  soft-tissue]
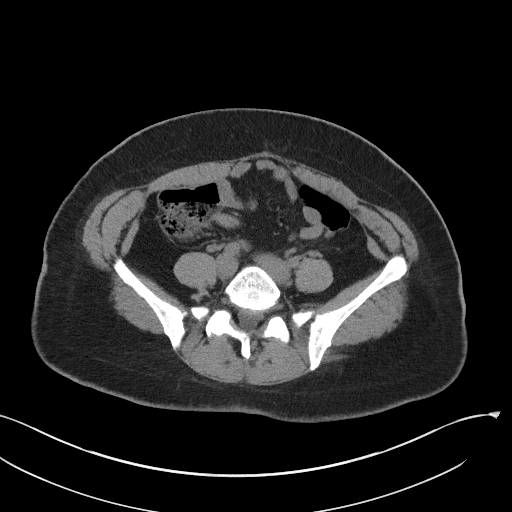
[im 51/93  soft-tissue]
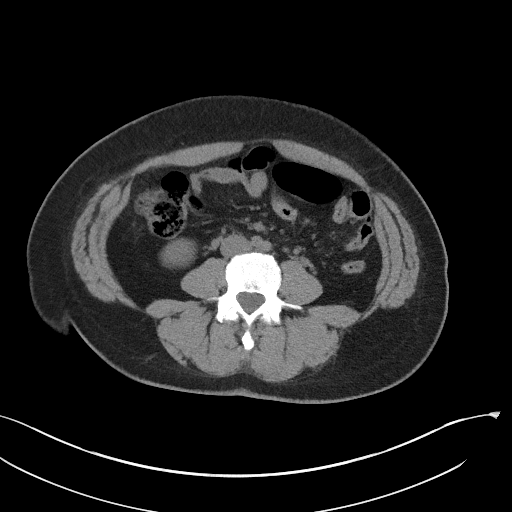
[im 56/93  soft-tissue]
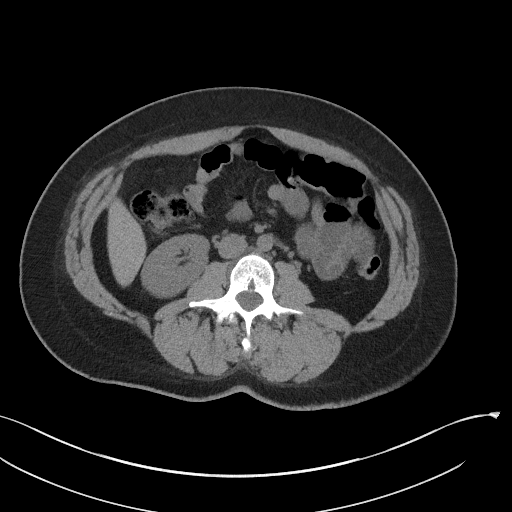
[im 65/93  soft-tissue]
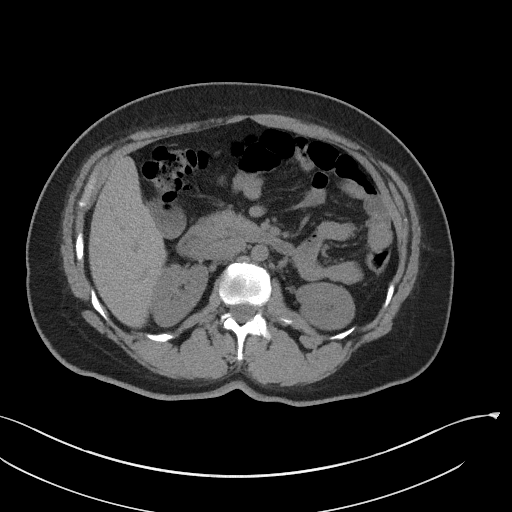
[im 65/93  bone]
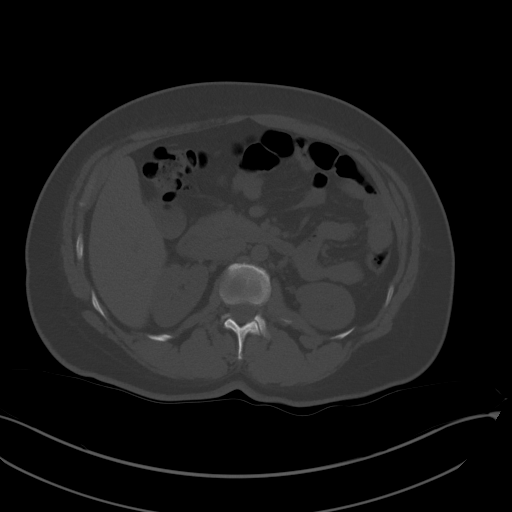
[im 74/93  soft-tissue]
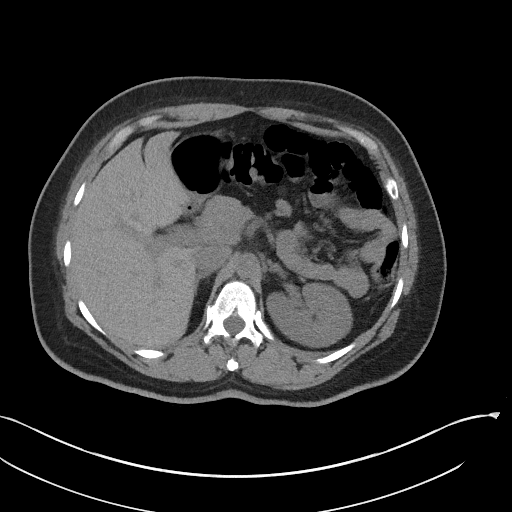
[im 79/93  soft-tissue]
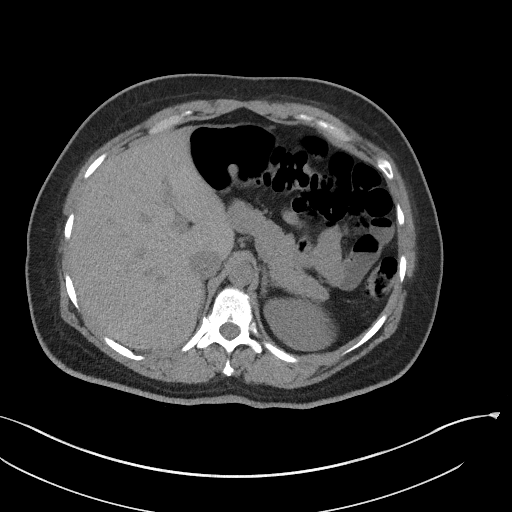
[im 88/93  soft-tissue]
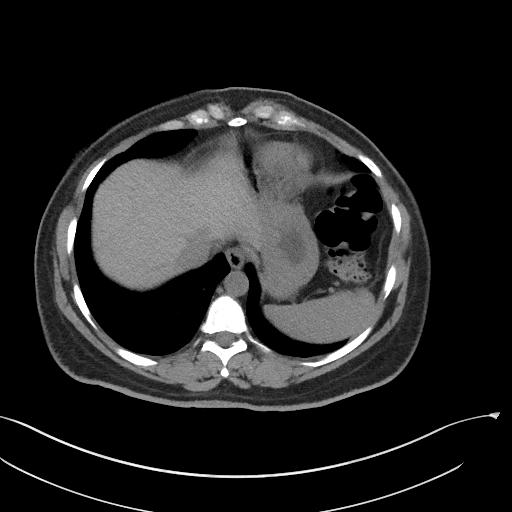

[Series 5: coronal st · coronal · 0.75mm/px · 3 of 98 slices shown]
[im 33/98  soft-tissue]
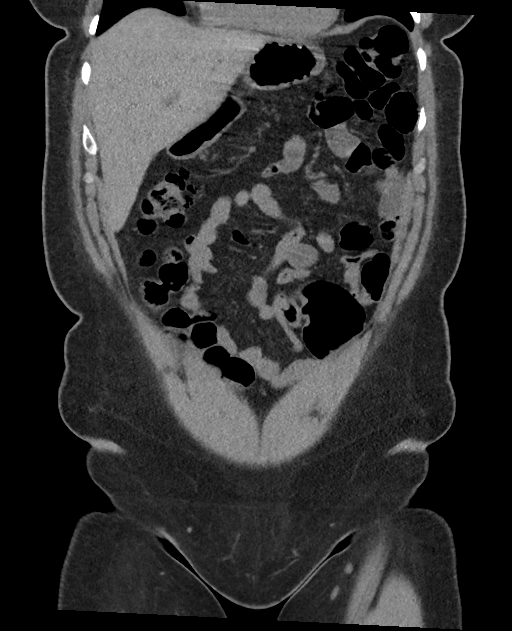
[im 44/98  soft-tissue]
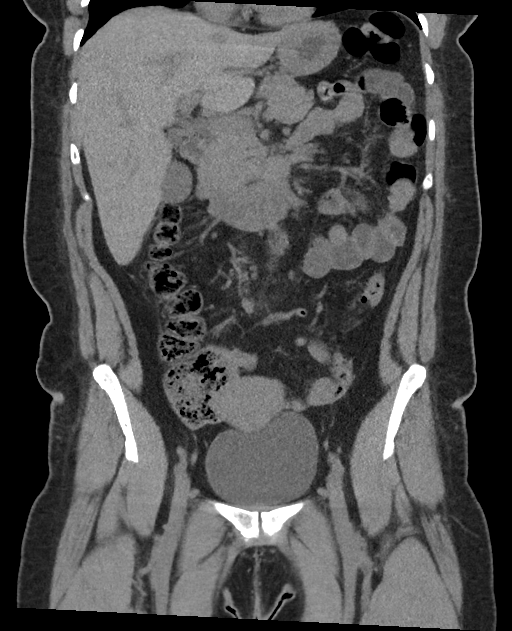
[im 54/98  soft-tissue]
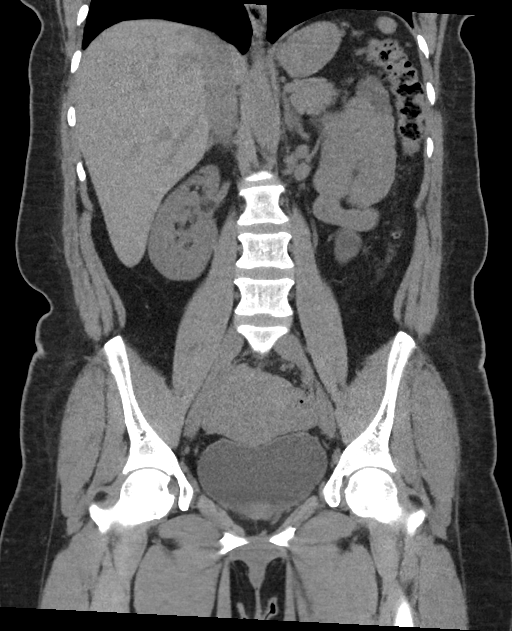

[15 of 46 positions shown; findings below may reference images not displayed]

FINDINGS: The lack of intravenous contrast limits the ability to evaluate
solid abdominal organs.

Lower chest: Limited visualization of the lower thorax is negative
for focal airspace opacity or pleural effusion.

Normal heart size.  No pericardial effusion.

Hepatobiliary: Normal hepatic contour. Normal appearance of the
gallbladder scattered normal noncontrast appearance of the
gallbladder given degree distention. No radiopaque gallstones. No
ascites.

Pancreas: Normal noncontrast appearance of the pancreas.

Spleen: Normal noncontrast appearance of the spleen.

Adrenals/Urinary Tract: Punctate (2 mm) nonobstructing renal stones
are seen within the superior pole the left kidney (coronal images 65
and 67, series 5). No definite right-sided renal stones. No renal
stones are seen along expected course of either ureter. Several
punctate phleboliths are seen with the lower pelvis bilaterally,
similar to the 3221 examination. Normal noncontrast appearance of
the urinary bladder given degree of distention. No urinary
obstruction or perinephric stranding.

Normal noncontrast appearance the bilateral adrenal glands.

Stomach/Bowel: Large colonic stool burden without evidence of
enteric obstruction. Normal noncontrast appearance of the terminal
ileum and the retrocecal appendix. No discrete areas of bowel wall
thickening on this noncontrast examination. No significant hiatal
hernia. No pneumoperitoneum, pneumatosis or portal venous gas.

Vascular/Lymphatic: Normal caliber of the abdominal aorta.

Scattered retroperitoneal and mesenteric lymph nodes are numerous
though individually not enlarged by size criteria, similar to the
3221 examination presumably reactive in etiology. Index mesenteric
root lymph node measures 0.6 cm in greatest short axis diameter
(image 45, series 2). No definitive bulky retroperitoneal,
mesenteric, pelvic or inguinal lymphadenopathy on this noncontrast
examination.

Reproductive: Stable noncontrast appearance of the pelvic organs. No
free fluid within the pelvic cul-de-sac.

Other: Small mesenteric fat containing periumbilical hernia.

Musculoskeletal: No acute or aggressive osseous abnormalities.
IMPRESSION: 1. No definite explanation for patient's centralized abdominal pain.
Specifically, no evidence of enteric or urinary obstruction. Normal
noncontrast appearance of the appendix.
2. Punctate (2 mm) nonobstructing left-sided renal stones. No
evidence of right-sided nephrolithiasis.

## 2023-12-26 ENCOUNTER — Other Ambulatory Visit: Payer: Self-pay

## 2023-12-26 ENCOUNTER — Ambulatory Visit
Admission: RE | Admit: 2023-12-26 | Discharge: 2023-12-26 | Disposition: A | Discharge status: Home / Self Care | Source: Ambulatory Visit | Attending: Emergency Medicine | Admitting: Emergency Medicine

## 2023-12-26 ENCOUNTER — Ambulatory Visit (INDEPENDENT_AMBULATORY_CARE_PROVIDER_SITE_OTHER)

## 2023-12-26 VITALS — BP 129/85 | HR 85 | Temp 98.5°F | Resp 16

## 2023-12-26 DIAGNOSIS — S9032XA Contusion of left foot, initial encounter: Secondary | ICD-10-CM

## 2023-12-26 DIAGNOSIS — M79672 Pain in left foot: Secondary | ICD-10-CM | POA: Diagnosis not present

## 2023-12-26 NOTE — ED Provider Notes (Signed)
 [facility]    CSN: [id number] Arrival date & time: 12/26/23  1753    HISTORY   Chief Complaint  Patient presents with  . Foot Injury    Possible fracture - Entered by patient   HPI [facility] is a pleasant, 48 y.o. female who presents to urgent care today.  Foot Injury  History reviewed. No pertinent past medical history. There are no active problems to display for this patient.  Past Surgical History:  Procedure Laterality Date  . ECTOPIC PREGNANCY SURGERY     OB History   No obstetric history on file.    Home Medications    Prior to Admission medications   Medication Sig Start Date End Date Taking? Authorizing Provider  cetirizine (ZYRTEC) 10 MG tablet Take by mouth. 07/25/16   [provider]  fluticasone (FLONASE) 50 MCG/ACT nasal spray Place into the nose. 07/25/16   [provider]  ibuprofen  (ADVIL ,MOTRIN ) 800 MG tablet Take 1 tablet (800 mg total) by mouth 3 (three) times daily. 03/11/18   Herlinda Milling, PA-C    Family History No family history on file. Social History Social History   Tobacco Use  . Smoking status: Never  . Smokeless tobacco: Never  Vaping Use  . Vaping status: Never Used  Substance Use Topics  . Alcohol use: Yes    Comment: OCC  . Drug use: Never   Allergies   Compazine [prochlorperazine edisylate]  Review of Systems Review of Systems Pertinent findings revealed after performing a 14 point review of systems has been noted in the history of present illness.  Physical Exam Vital Signs BP 129/85 (BP Location: Right Arm)   Pulse 85   Temp 98.5 F (36.9 C) (Oral)   Resp 16   LMP 12/11/2023   SpO2 97%   No data found.  Physical Exam  [facility] / Diagnostics / Procedures:     Radiology No results found.  Procedures Procedures (including critical care time) EKG  Pending results:  Labs Reviewed - No data to display  Medications Ordered in UC: Medications - No data to display  UC  Diagnoses / Final Clinical Impressions(s)   I have reviewed the triage vital signs and the nursing notes.  Pertinent labs & imaging results that were available during my care of the patient were reviewed by me and considered in my medical decision making (see chart for details).    Final diagnoses:  Left foot pain    {LOWERBACKPAINPLAN:26747}  Please see discharge instructions below for details of plan of care as provided to patient. ED Prescriptions   None    PDMP not reviewed this encounter.  Discharge Instructions   None     Disposition Upon Discharge:  Condition: stable for discharge home Home: take medications as prescribed; routine discharge instructions as discussed; follow up as advised.  Patient presented with an acute illness with associated systemic symptoms and significant discomfort requiring urgent management. In my opinion, this is a condition that a prudent lay person (someone who possesses an average knowledge of health and medicine) may potentially expect to result in complications if not addressed urgently such as respiratory distress, impairment of bodily function or dysfunction of bodily organs.   Routine symptom specific, illness specific and/or disease specific instructions were discussed with the patient and/or caregiver at length.   As such, the patient has been evaluated and assessed, work-up was performed and treatment was provided in alignment with urgent care protocols and evidence based medicine.  Patient/parent/caregiver has been advised that the patient may require follow up for further testing and treatment if the symptoms continue in spite of treatment, as clinically indicated and appropriate.  Patient/parent/caregiver has been advised to report to orthopedic urgent care clinic or return to the [facility] or PCP in 3-5 days if no better; follow-up with orthopedics, PCP or the Emergency Department if new signs and symptoms develop or if the current signs or  symptoms continue to change or worsen for further workup, evaluation and treatment as clinically indicated and appropriate  The patient will follow up with their current PCP if and as advised. If the patient does not currently have a PCP we will have assisted them in obtaining one.   The patient may need specialty follow up if the symptoms continue, in spite of conservative treatment and management, for further workup, evaluation, consultation and treatment as clinically indicated and appropriate.  Patient/parent/caregiver verbalized understanding and agreement of plan as discussed.  All questions were addressed during visit.  Please see discharge instructions below for further details of plan.  This office note has been dictated using Teaching laboratory technician.  Unfortunately, this method of dictation can sometimes lead to typographical or grammatical errors.  I apologize for your inconvenience in advance if this occurs.  Please do not hesitate to reach out to me if clarification is needed.

## 2023-12-26 NOTE — ED Triage Notes (Signed)
 C/O left dorsal foot pain after falling on some stairs two days ago. C/O increased pain with movement. Denies other injury.

## 2023-12-26 NOTE — Discharge Instructions (Signed)
 I am happy to report that there was no acute fracture or joint dislocation of your left foot.  I have provided a copy of the radiologist report for your records.  Recommend that you keep your foot elevated and ice it is much as possible to help bring down any swelling and to reduce pain.  If your foot is not feeling any better after 7 to 10 days, please follow-up with an orthopedic specialist for further evaluation.  More advanced imaging may be recommended such as ultrasound or MRI.  Thank you for visiting [facility] urgent care today.
